# Patient Record
Sex: Female | Born: 1970
Health system: Southern US, Community
[De-identification: ages and names within clinical notes are randomized; demographics above are authoritative.]

## PROBLEM LIST (undated history)

## (undated) DIAGNOSIS — R42 Dizziness and giddiness: Secondary | ICD-10-CM

## (undated) DIAGNOSIS — J45909 Unspecified asthma, uncomplicated: Secondary | ICD-10-CM

---

## 1999-03-19 ENCOUNTER — Other Ambulatory Visit: Admission: RE | Admit: 1999-03-19 | Discharge: 1999-03-19 | Payer: Self-pay | Admitting: Emergency Medicine

## 2000-07-03 ENCOUNTER — Other Ambulatory Visit: Admission: RE | Admit: 2000-07-03 | Discharge: 2000-07-03 | Payer: Self-pay | Admitting: Emergency Medicine

## 2001-04-07 ENCOUNTER — Encounter (INDEPENDENT_AMBULATORY_CARE_PROVIDER_SITE_OTHER): Payer: Self-pay

## 2001-04-07 ENCOUNTER — Inpatient Hospital Stay (HOSPITAL_COMMUNITY): Admission: AD | Admit: 2001-04-07 | Discharge: 2001-04-11 | Payer: Self-pay | Admitting: *Deleted

## 2001-06-09 ENCOUNTER — Other Ambulatory Visit: Admission: RE | Admit: 2001-06-09 | Discharge: 2001-06-09 | Payer: Self-pay | Admitting: Gynecology

## 2002-07-12 ENCOUNTER — Other Ambulatory Visit: Admission: RE | Admit: 2002-07-12 | Discharge: 2002-07-12 | Payer: Self-pay | Admitting: *Deleted

## 2002-10-13 ENCOUNTER — Other Ambulatory Visit: Admission: RE | Admit: 2002-10-13 | Discharge: 2002-10-13 | Payer: Self-pay | Admitting: Gynecology

## 2002-10-22 ENCOUNTER — Ambulatory Visit (HOSPITAL_COMMUNITY): Admission: RE | Admit: 2002-10-22 | Discharge: 2002-10-22 | Payer: Self-pay | Admitting: Internal Medicine

## 2002-10-22 ENCOUNTER — Encounter: Payer: Self-pay | Admitting: Internal Medicine

## 2004-08-09 ENCOUNTER — Other Ambulatory Visit: Admission: RE | Admit: 2004-08-09 | Discharge: 2004-08-09 | Payer: Self-pay | Admitting: Gynecology

## 2006-07-02 ENCOUNTER — Other Ambulatory Visit: Admission: RE | Admit: 2006-07-02 | Discharge: 2006-07-02 | Payer: Self-pay | Admitting: Gynecology

## 2007-08-31 ENCOUNTER — Other Ambulatory Visit: Admission: RE | Admit: 2007-08-31 | Discharge: 2007-08-31 | Payer: Self-pay | Admitting: Gynecology

## 2008-03-18 ENCOUNTER — Ambulatory Visit: Payer: Self-pay | Admitting: Women's Health

## 2008-08-16 ENCOUNTER — Emergency Department (HOSPITAL_COMMUNITY): Admission: EM | Admit: 2008-08-16 | Discharge: 2008-08-16 | Payer: Self-pay | Admitting: *Deleted

## 2008-09-15 ENCOUNTER — Other Ambulatory Visit: Admission: RE | Admit: 2008-09-15 | Discharge: 2008-09-15 | Payer: Self-pay | Admitting: Gynecology

## 2008-09-15 ENCOUNTER — Encounter: Payer: Self-pay | Admitting: Women's Health

## 2008-09-15 ENCOUNTER — Ambulatory Visit: Payer: Self-pay | Admitting: Women's Health

## 2009-09-18 ENCOUNTER — Other Ambulatory Visit: Admission: RE | Admit: 2009-09-18 | Discharge: 2009-09-18 | Payer: Self-pay | Admitting: Gynecology

## 2009-09-18 ENCOUNTER — Ambulatory Visit: Payer: Self-pay | Admitting: Women's Health

## 2009-12-18 ENCOUNTER — Ambulatory Visit: Payer: Self-pay | Admitting: Women's Health

## 2010-01-22 ENCOUNTER — Ambulatory Visit: Payer: Self-pay | Admitting: Gynecology

## 2010-02-06 ENCOUNTER — Ambulatory Visit: Payer: Self-pay | Admitting: Gynecology

## 2010-09-28 NOTE — Discharge Summary (Signed)
[facility]  PatientJEYDI, [name] Visit Number: [id number] MRN: [id number]          Service Type: Attending:  Gaetano Hawthorne. Lily Peer, M.D. Dictated by:   Antony Contras, [facility] Adm. Date:  04/07/01 Disc. Date: 04/11/01                             Discharge Summary  DISCHARGE DIAGNOSES:          1. Nonreassuring fetal heart rate tracing,                                  deep variables with late component                               2. Nuchal cord.                               3. Body cord.  PROCEDURES:                   Emergent primary low uterine transverse cesarean section with delivery of viable infant.  HISTORY OF PRESENT ILLNESS:   The patient is a 40 year old primigravida with an LMP of June 27, 2000, [facility] April 01, 2001.  Prenatal risk factors include a history of asthma.  PRENATAL LABORATORY DATA:     Blood type O positive, antibody screen negative. RPR, HBsAg, HIV nonreactive.  HOSPITAL COURSE:              The patient was admitted with onset of labor April 07, 2001.  On admission cervix was 2 cm, 50%, -1 station.  She had some mildly elevated blood pressure on admission with normal [facility] laboratories. During the course of her labor she developed a nonreassuring fetal heart rate tracing consistent with repetitive deep variable decelerations.  At the time her contractions was slower than baseline.  She did reach complete dilatation and attempted knee-chest position, ______ lateral position, and continued to push, without any further descent from 1+ station.  Since she was having repetitive deep variables and late components, it was decided to proceed with emergent primary low uterine segment transverse cesarean section.  This was performed by Dr. Lily Peer under epidural and general endotracheal anesthesia. Findings include delivery of a viable female infant in vertex presentation, nuchal cord x1.  The cord was also wrapped  around the left shoulder.  Clear amniotic fluid with vernix.  Weight 7 pounds 10 ounces, Apgars of 3 and 8, arterial cord pH 7.20.  POSTPARTUM COURSE:            The patient remained afebrile.  Had no difficulty voiding.  Was able to be discharged on her third postoperative day in satisfactory condition.  CBC:  Hematocrit 29, hemoglobin 10.5, WBC 223.  FOLLOW-UP:                    In six weeks.  MEDICATIONS:                  Continue with prenatal vitamins and iron. Motrin or Tylox for pain. Dictated by:   Antony Contras, [facility] Attending:  Gaetano Hawthorne. Lily Peer, M.D. DD:  05/15/01 TD:  05/16/01 Job: [id number]

## 2010-09-28 NOTE — Op Note (Signed)
[facility]  Patient:    [name], [name] Visit Number: [id number] MRN: [id number]          Service Type: OBS Location: 910A [id number] Attending Physician:  Merrily Pew Dictated by:   Gaetano Hawthorne. Lily Peer, M.D. Proc. Date: 04/08/01 Admit Date:  04/07/2001                             Operative Report  PREOPERATIVE DIAGNOSES:       1. Nonreassuring fetal heart rate tracing (deep variables with late component).                               2. Second stage of labor.                               3. Term intrauterine pregnancy.  POSTOPERATIVE DIAGNOSES:      1. Nonreassuring fetal heart rate tracing (deep variables with late component).                               2. Nuchal cord.                               3. Body cord.  OPERATION:                    Emergency primary lower uterine segment transverse cesarean section.  SURGEON:                      Juan H. Lily Peer, M.D.  ANESTHESIA:                   Epidural and general endotracheal anesthesia.  INDICATIONS:                  A 40 year old, gravida 1, para 0, term with nonreassuring fetal rate tracing consisting of repetitive deep variable decelerations at time of contractions with slow rise to baseline.  The patient reached complete dilatation, at attempted knee-chest position, oxygen, lateral positioning, and continued to push without any further descent from the +1 station.  Enough time was not given due to the fact that she was having repetitive deep variables with late components down to the 50-60 beats per minute range.  FINDINGS:                     Viable female infant in vertex presentation, nuchal cord x 1.  Cord was also wrapped around the left shoulder.  Clear amniotic with vernix present.  Weight 7 pounds 10 ounces.  Apgars 3 and 8. Arterial cord pH 7.20, delivery time 1617 hours.  COMPLICATIONS:                None.  DESCRIPTION OF PROCEDURE:     After the patient was  adequately counseled, she was taken to the operating room in the knee-chest position due to the fact that persistent deep variable decelerations down to the 50-60 minute range was evident with late component despite knee-chest position and oxygen administration.  She was rushed immediately to the operating room and both her and her husband were counseled verbally en route to the operating room.  Once in  the operating room, her fetal heart reached to 134.  She was placed in the supine position.  She was rechecked and the cervix was found to be essentially unchanged.  Complete dilatation, +1 station, and immediately on the supine position began having deep variable deceleration down to 50-60 beats per minute range with slow rise to baseline.  The abdomen was prepped and draped in the usual sterile fashion.  The epidural was not effective and the patient had to undergo general endotracheal anesthesia.  Once the drapes had been placed and the patient was intubated,  a Pfannenstiel skin incision was made 2 cm above the symphysis pubis.  The incision was carried down through the skin and subcutaneous tissue and down to the rectus fascia where a midline nick was made.  The fascia was incised in a transverse fashion.  The peritoneum cavity was entered and the bladder flap was established.  The lower uterine segment was incised in the transverse fashion.  Clear amniotic fluid with extensive amount of vernix was present.  The newborns head was delivered.  Manual reduction of the cord was accomplished and then the cord was wrapped around the left shoulder, reduced and the rest of the newborn was delivered.    delivered.  The nasopharyngeal area was bulb suctioned.  The cord was doubly clamped and excised and the newborn was passed off to the pediatrician in attendance.  After cord blood was obtained, the placenta was delivered from the anterior cavity and submitted for histologic evaluation. The  anterior uterine cavity was swept clear of remaining products of conception.  The uterus was closed in a two-layer fashion with 0 Vicryl suture; first layer an interlocking stitch, second layer in an imbricating manner.  Both tubes and ovaries were normal in appearance.  The uterus was placed back in the abdominal cavity.  The pelvic cavity was copiously irrigated with normal saline solution.  After ascertaining adequate hemostasis, fascia was then closed.  The visceral peritoneum was now reapproximated.  The fascia was closed with running stitches of 0 Vicryl suture.  The subcutaneous bleeders were Bovie cauterized and skin was reapproximated with skin clips followed by placement of Xeroform gauze and 4 x 4 dressing.  The patient was extubated, transferred to recovery room with stable vital signs.  Blood loss was 500 cc.  Urine output was 150 cc.  Fluid resuscitation consisted of 2500 lactated Ringers.  She received 1 g of Cefotan IV. Dictated by:   Gaetano Hawthorne Lily Peer, M.D. Attending Physician:  Merrily Pew DD:  04/08/01 TD:  04/09/01 Job: [id number]

## 2010-11-08 ENCOUNTER — Other Ambulatory Visit (HOSPITAL_COMMUNITY)
Admission: RE | Admit: 2010-11-08 | Discharge: 2010-11-08 | Disposition: A | Discharge status: Home / Self Care | Payer: BC Managed Care – PPO | Source: Ambulatory Visit | Attending: Family Medicine | Admitting: Family Medicine

## 2010-11-08 ENCOUNTER — Other Ambulatory Visit: Payer: Self-pay | Admitting: Family Medicine

## 2010-11-08 DIAGNOSIS — Z124 Encounter for screening for malignant neoplasm of cervix: Secondary | ICD-10-CM | POA: Insufficient documentation

## 2010-11-08 DIAGNOSIS — Z1159 Encounter for screening for other viral diseases: Secondary | ICD-10-CM | POA: Insufficient documentation

## 2013-06-14 ENCOUNTER — Other Ambulatory Visit: Payer: Self-pay

## 2013-06-14 DIAGNOSIS — Z1231 Encounter for screening mammogram for malignant neoplasm of breast: Secondary | ICD-10-CM

## 2013-07-05 ENCOUNTER — Ambulatory Visit: Payer: BC Managed Care – PPO

## 2013-07-19 ENCOUNTER — Ambulatory Visit
Admission: RE | Admit: 2013-07-19 | Discharge: 2013-07-19 | Disposition: A | Discharge status: Home / Self Care | Payer: BC Managed Care – PPO | Source: Ambulatory Visit

## 2013-07-19 DIAGNOSIS — Z1231 Encounter for screening mammogram for malignant neoplasm of breast: Secondary | ICD-10-CM

## 2014-03-16 ENCOUNTER — Other Ambulatory Visit: Payer: Self-pay | Admitting: Family Medicine

## 2014-03-16 ENCOUNTER — Other Ambulatory Visit (HOSPITAL_COMMUNITY)
Admission: RE | Admit: 2014-03-16 | Discharge: 2014-03-16 | Disposition: A | Discharge status: Home / Self Care | Payer: BC Managed Care – PPO | Source: Ambulatory Visit | Attending: Family Medicine | Admitting: Family Medicine

## 2014-03-16 DIAGNOSIS — Z1151 Encounter for screening for human papillomavirus (HPV): Secondary | ICD-10-CM | POA: Diagnosis present

## 2014-03-16 DIAGNOSIS — Z01419 Encounter for gynecological examination (general) (routine) without abnormal findings: Secondary | ICD-10-CM | POA: Insufficient documentation

## 2014-03-18 LAB — CYTOLOGY - PAP

## 2014-07-27 ENCOUNTER — Other Ambulatory Visit: Payer: Self-pay

## 2014-07-27 DIAGNOSIS — Z1231 Encounter for screening mammogram for malignant neoplasm of breast: Secondary | ICD-10-CM

## 2014-08-10 ENCOUNTER — Ambulatory Visit
Admission: RE | Admit: 2014-08-10 | Discharge: 2014-08-10 | Disposition: A | Discharge status: Home / Self Care | Payer: BLUE CROSS/BLUE SHIELD | Source: Ambulatory Visit

## 2014-08-10 DIAGNOSIS — Z1231 Encounter for screening mammogram for malignant neoplasm of breast: Secondary | ICD-10-CM

## 2014-08-12 ENCOUNTER — Other Ambulatory Visit: Payer: Self-pay | Admitting: Family Medicine

## 2014-08-12 DIAGNOSIS — R928 Other abnormal and inconclusive findings on diagnostic imaging of breast: Secondary | ICD-10-CM

## 2014-08-16 ENCOUNTER — Ambulatory Visit
Admission: RE | Admit: 2014-08-16 | Discharge: 2014-08-16 | Disposition: A | Discharge status: Home / Self Care | Payer: BLUE CROSS/BLUE SHIELD | Source: Ambulatory Visit | Attending: Family Medicine | Admitting: Family Medicine

## 2014-08-16 DIAGNOSIS — R928 Other abnormal and inconclusive findings on diagnostic imaging of breast: Secondary | ICD-10-CM

## 2015-08-10 ENCOUNTER — Other Ambulatory Visit: Payer: Self-pay

## 2015-08-10 DIAGNOSIS — Z1231 Encounter for screening mammogram for malignant neoplasm of breast: Secondary | ICD-10-CM

## 2015-08-30 ENCOUNTER — Ambulatory Visit
Admission: RE | Admit: 2015-08-30 | Discharge: 2015-08-30 | Disposition: A | Discharge status: Home / Self Care | Payer: BLUE CROSS/BLUE SHIELD | Source: Ambulatory Visit

## 2015-08-30 DIAGNOSIS — Z1231 Encounter for screening mammogram for malignant neoplasm of breast: Secondary | ICD-10-CM

## 2016-09-09 ENCOUNTER — Other Ambulatory Visit: Payer: Self-pay | Admitting: Family Medicine

## 2016-09-09 DIAGNOSIS — Z1231 Encounter for screening mammogram for malignant neoplasm of breast: Secondary | ICD-10-CM

## 2016-09-25 ENCOUNTER — Ambulatory Visit
Admission: RE | Admit: 2016-09-25 | Discharge: 2016-09-25 | Disposition: A | Discharge status: Home / Self Care | Payer: BLUE CROSS/BLUE SHIELD | Source: Ambulatory Visit | Attending: Family Medicine | Admitting: Family Medicine

## 2016-09-25 DIAGNOSIS — Z1231 Encounter for screening mammogram for malignant neoplasm of breast: Secondary | ICD-10-CM

## 2017-04-14 ENCOUNTER — Other Ambulatory Visit (HOSPITAL_COMMUNITY)
Admission: RE | Admit: 2017-04-14 | Discharge: 2017-04-14 | Disposition: A | Discharge status: Home / Self Care | Payer: BLUE CROSS/BLUE SHIELD | Source: Ambulatory Visit | Attending: Family Medicine | Admitting: Family Medicine

## 2017-04-14 ENCOUNTER — Other Ambulatory Visit: Payer: Self-pay | Admitting: Family Medicine

## 2017-04-14 DIAGNOSIS — Z124 Encounter for screening for malignant neoplasm of cervix: Secondary | ICD-10-CM | POA: Insufficient documentation

## 2017-04-15 LAB — CYTOLOGY - PAP
Diagnosis: NEGATIVE
HPV (WINDOPATH): NOT DETECTED

## 2017-06-10 ENCOUNTER — Emergency Department (HOSPITAL_COMMUNITY)
Admission: EM | Admit: 2017-06-10 | Discharge: 2017-06-11 | Disposition: A | Discharge status: Home / Self Care | Payer: BLUE CROSS/BLUE SHIELD | Attending: Emergency Medicine | Admitting: Emergency Medicine

## 2017-06-10 ENCOUNTER — Encounter (HOSPITAL_COMMUNITY): Payer: Self-pay | Admitting: Emergency Medicine

## 2017-06-10 DIAGNOSIS — R55 Syncope and collapse: Secondary | ICD-10-CM

## 2017-06-10 DIAGNOSIS — J45909 Unspecified asthma, uncomplicated: Secondary | ICD-10-CM | POA: Insufficient documentation

## 2017-06-10 DIAGNOSIS — R42 Dizziness and giddiness: Secondary | ICD-10-CM

## 2017-06-10 DIAGNOSIS — Z87891 Personal history of nicotine dependence: Secondary | ICD-10-CM | POA: Insufficient documentation

## 2017-06-10 DIAGNOSIS — E860 Dehydration: Secondary | ICD-10-CM | POA: Diagnosis not present

## 2017-06-10 HISTORY — DX: Dizziness and giddiness: R42

## 2017-06-10 HISTORY — DX: Unspecified asthma, uncomplicated: J45.909

## 2017-06-10 LAB — BASIC METABOLIC PANEL
Anion gap: 15 (ref 5–15)
BUN: 17 mg/dL (ref 6–20)
CALCIUM: 9.9 mg/dL (ref 8.9–10.3)
CHLORIDE: 101 mmol/L (ref 101–111)
CO2: 20 mmol/L — ABNORMAL LOW (ref 22–32)
Creatinine, Ser: 1.09 mg/dL — ABNORMAL HIGH (ref 0.44–1.00)
GFR calc non Af Amer: >60 mL/min (ref >60)
GLUCOSE: 193 mg/dL — AB (ref 65–99)
Potassium: 3.7 mmol/L (ref 3.5–5.1)
Sodium: 136 mmol/L (ref 135–145)

## 2017-06-10 LAB — I-STAT BETA HCG BLOOD, ED (MC, WL, AP ONLY): I-stat hCG, quantitative: 5.6 m[IU]/mL — ABNORMAL HIGH (ref <5)

## 2017-06-10 LAB — CBC
HCT: 38.1 % (ref 36.0–46.0)
Hemoglobin: 13 g/dL (ref 12.0–15.0)
MCH: 30 pg (ref 26.0–34.0)
MCHC: 34.1 g/dL (ref 30.0–36.0)
MCV: 88 fL (ref 78.0–100.0)
Platelets: 419 10*3/uL — ABNORMAL HIGH (ref 150–400)
RBC: 4.33 MIL/uL (ref 3.87–5.11)
RDW: 14.1 % (ref 11.5–15.5)
WBC: 16.7 10*3/uL — ABNORMAL HIGH (ref 4.0–10.5)

## 2017-06-10 LAB — URINALYSIS, ROUTINE W REFLEX MICROSCOPIC
Bacteria, UA: NONE SEEN
Bilirubin Urine: NEGATIVE
Glucose, UA: NEGATIVE mg/dL
Hgb urine dipstick: NEGATIVE
Ketones, ur: 80 mg/dL — AB
Leukocytes, UA: NEGATIVE
Nitrite: NEGATIVE
PH: 5 (ref 5.0–8.0)
Protein, ur: 30 mg/dL — AB
SPECIFIC GRAVITY, URINE: 1.029 (ref 1.005–1.030)

## 2017-06-10 NOTE — ED Triage Notes (Signed)
BIB EMS from home for onset of dizziness, N/V at 2030. Pt states hx of vertigo but states this episode was worse than usual. Pt also had panic attack en route. Given 4mg  Zofran IV en route

## 2017-06-11 LAB — PREGNANCY, URINE: Preg Test, Ur: NEGATIVE

## 2017-06-11 MED ORDER — SODIUM CHLORIDE 0.9 % IV BOLUS (SEPSIS)
1000.0000 mL | Freq: Once | INTRAVENOUS | Status: AC
  Administered 2017-06-11: 1000 mL via INTRAVENOUS

## 2017-06-11 MED ORDER — ONDANSETRON HCL 4 MG/2ML IJ SOLN
4.0000 mg | Freq: Once | INTRAMUSCULAR | Status: AC
  Administered 2017-06-11: 4 mg via INTRAVENOUS
  Filled 2017-06-11: qty 2

## 2017-06-11 NOTE — Discharge Instructions (Signed)

## 2017-06-11 NOTE — ED Notes (Signed)
PT states understanding of care given, follow up care. PT ambulated from ED to car with a steady gait.  

## 2017-06-11 NOTE — ED Provider Notes (Signed)
[facility] EMERGENCY DEPARTMENT Provider Note   CSN: [id number] Arrival date & time: 06/10/17  2308     History   Chief Complaint Chief Complaint  Patient presents with  . Dizziness  . Emesis    HPI [name] is a 47 y.o. female.  The history is provided by the patient.  Dizziness  Quality:  Lightheadedness and room spinning Severity:  Moderate Onset quality:  Sudden Duration:  4 hours Timing:  Constant Progression:  Improving Chronicity:  Recurrent Relieved by:  Being still Worsened by:  Movement Associated symptoms: headaches, nausea, shortness of breath and vomiting   Associated symptoms: no chest pain, no hearing loss, no syncope, no tinnitus and no vision changes   Risk factors: no heart disease and no new medications   Emesis   Associated symptoms include headaches. Pertinent negatives include no fever.  Patient with history of asthma, history of recurrent vertigo, presents with feeling lightheaded, near syncope, and vertigo. In terms of her vertigo, she reports she gets this frequently, and usually has to take a Dramamine during the day. Tonight soon after dinner she began having feeling of lightheadedness, nausea and she went to the restroom and vomited.  It was nonbloody.  No diarrhea reported after that she started to feel like her vertigo was returning.  She has mild headache.  She has no focal arm or leg weakness.  No hearing or visual changes.  No diplopia. No active chest pain.  Soon after the episode of vomiting she began to have shortness of breath and felt very anxious with numbness throughout her entire body, that is improving No head trauma reported  No history of CVA/CAD/PE  Past Medical History:  Diagnosis Date  . Asthma   . Vertigo     There are no active problems to display for this patient.   Past Surgical History:  Procedure Laterality Date  . CESAREAN SECTION      OB History    No data available        Home Medications    Prior to Admission medications   Not on File    Family History No family history on file.  Social History Social History   Tobacco Use  . Smoking status: Former Smoker    Types: Cigarettes    Last attempt to quit: 05/2007    Years since quitting: 10.0  . Smokeless tobacco: Never Used  Substance Use Topics  . Alcohol use: Yes    Comment: occasional, 2/wk  . Drug use: No     Allergies   Penicillins and Sulfa antibiotics   Review of Systems Review of Systems  Constitutional: Negative for fever.  HENT: Negative for hearing loss and tinnitus.   Respiratory: Positive for shortness of breath.   Cardiovascular: Negative for chest pain and syncope.  Gastrointestinal: Positive for nausea and vomiting.  Neurological: Positive for dizziness and headaches. Negative for seizures and syncope.  Psychiatric/Behavioral: The patient is nervous/anxious.   All other systems reviewed and are negative.    Physical Exam Updated Vital Signs BP 111/84 (BP Location: Right Arm)   Pulse 95   Temp 97.6 F (36.4 C) (Oral)   Resp 18   Ht 1.727 m (5\' 8" )   Wt 117.9 kg (260 lb)   LMP 05/13/2017 (Approximate)   SpO2 100%   BMI 39.53 kg/m   Physical Exam CONSTITUTIONAL: Well developed/well nourished HEAD: Normocephalic/atraumatic EYES: EOMI/PERRL, no nystagmus, no ptosis ENMT: Mucous membranes moist NECK: supple no  meningeal signs, no bruits CV: S1/S2 noted, no murmurs/rubs/gallops noted LUNGS: Lungs are clear to auscultation bilaterally, no apparent distress ABDOMEN: soft, nontender, no rebound or guarding GU:no cva tenderness NEURO:Awake/alert, face symmetric, no arm or leg drift is noted Equal 5/5 strength with shoulder abduction, elbow flex/extension, wrist flex/extension in upper extremities and equal hand grips bilaterally Equal 5/5 strength with hip flexion,knee flex/extension, foot dorsi/plantar flexion Cranial nerves 3/4/5/6/11/18/08/11/12 tested  and intact Gait normal without ataxia No past pointing Sensation to light touch intact in all extremities EXTREMITIES: pulses normal, full ROM SKIN: warm, color normal PSYCH: no abnormalities of mood noted   ED Treatments / Results  Labs (all labs ordered are listed, but only abnormal results are displayed) Labs Reviewed  BASIC METABOLIC PANEL - Abnormal; Notable for the following components:      Result Value   CO2 20 (*)    Glucose, Bld 193 (*)    Creatinine, Ser 1.09 (*)    All other components within normal limits  CBC - Abnormal; Notable for the following components:   WBC 16.7 (*)    Platelets 419 (*)    All other components within normal limits  URINALYSIS, ROUTINE W REFLEX MICROSCOPIC - Abnormal; Notable for the following components:   APPearance CLOUDY (*)    Ketones, ur 80 (*)    Protein, ur 30 (*)    Squamous Epithelial / LPF 6-30 (*)    All other components within normal limits  I-STAT BETA HCG BLOOD, ED ([facility], [facility], [facility] ONLY) - Abnormal; Notable for the following components:   I-stat hCG, quantitative 5.6 (*)    All other components within normal limits  PREGNANCY, URINE    EKG  EKG Interpretation  Date/Time:  Tuesday June 10 2017 23:13:50 EST Ventricular Rate:  96 PR Interval:  130 QRS Duration: 88 QT Interval:  384 QTC Calculation: 485 R Axis:   98 Text Interpretation:  Normal sinus rhythm Rightward axis Prolonged QT Abnormal ECG No previous ECGs available Confirmed by Zadie Rhine ([id number]) on 06/11/2017 1:06:33 AM       EKG Interpretation  Date/Time:  Wednesday June 11 2017 02:29:58 EST Ventricular Rate:  70 PR Interval:  130 QRS Duration: 102 QT Interval:  438 QTC Calculation: 473 R Axis:   88 Text Interpretation:  Sinus rhythm HR improved from prior Confirmed by Zadie Rhine ([id number]) on 06/11/2017 3:09:49 AM       Radiology No results found.  Procedures Procedures   Medications Ordered in ED Medications  sodium chloride 0.9  % bolus 1,000 mL (1,000 mLs Intravenous New Bag/Given 06/11/17 0211)  ondansetron (ZOFRAN) injection 4 mg (4 mg Intravenous Given 06/11/17 0211)     Initial Impression / Assessment and Plan / ED Course  I have reviewed the triage vital signs and the nursing notes.  Pertinent labs results that were available during my care of the patient were reviewed by me and considered in my medical decision making (see chart for details).     1:41 AM Patient with history of recurrent vertigo, presents with a near syncopal episode as well as vertigo.  She has no focal neuro deficits, no ataxia noted, she is well-appearing.  Will treat with IV fluids and Zofran and reassess.  Will also need repeat EKG. Of note, patient is noted to have dehydration.  3:42 AM Repeat EKG improved, no prolonged QT.  Patient feels improved, ambulatory without difficulty.  No focal neuro deficits. My suspicion for acute neurologic emergency is low She is  low risk for syncope I feel she is appropriate d/c home and outpatient followup  Final Clinical Impressions(s) / ED Diagnoses   Final diagnoses:  Near syncope  Dehydration  Vertigo    ED Discharge Orders    None       Zadie RhineWickline, Letita Prentiss, MD 06/11/17 [phone]

## 2017-07-10 ENCOUNTER — Other Ambulatory Visit: Payer: Self-pay | Admitting: Otolaryngology

## 2017-07-10 DIAGNOSIS — H9311 Tinnitus, right ear: Secondary | ICD-10-CM

## 2017-07-10 DIAGNOSIS — G4452 New daily persistent headache (NDPH): Secondary | ICD-10-CM

## 2017-07-10 DIAGNOSIS — H918X2 Other specified hearing loss, left ear: Secondary | ICD-10-CM

## 2017-07-10 DIAGNOSIS — IMO0001 Reserved for inherently not codable concepts without codable children: Secondary | ICD-10-CM

## 2017-10-31 ENCOUNTER — Other Ambulatory Visit: Payer: Self-pay | Admitting: Family Medicine

## 2017-10-31 DIAGNOSIS — Z1231 Encounter for screening mammogram for malignant neoplasm of breast: Secondary | ICD-10-CM

## 2017-11-28 ENCOUNTER — Ambulatory Visit: Payer: BLUE CROSS/BLUE SHIELD

## 2017-12-18 ENCOUNTER — Ambulatory Visit
Admission: RE | Admit: 2017-12-18 | Discharge: 2017-12-18 | Disposition: A | Discharge status: Home / Self Care | Payer: 59 | Source: Ambulatory Visit | Attending: Family Medicine | Admitting: Family Medicine

## 2017-12-18 DIAGNOSIS — Z1231 Encounter for screening mammogram for malignant neoplasm of breast: Secondary | ICD-10-CM | POA: Diagnosis not present

## 2018-04-21 DIAGNOSIS — Z131 Encounter for screening for diabetes mellitus: Secondary | ICD-10-CM | POA: Diagnosis not present

## 2018-04-21 DIAGNOSIS — Z13220 Encounter for screening for lipoid disorders: Secondary | ICD-10-CM | POA: Diagnosis not present

## 2018-04-21 DIAGNOSIS — Z6841 Body Mass Index (BMI) 40.0 and over, adult: Secondary | ICD-10-CM | POA: Diagnosis not present

## 2018-04-21 DIAGNOSIS — Z0000 Encounter for general adult medical examination without abnormal findings: Secondary | ICD-10-CM | POA: Diagnosis not present

## 2019-01-25 ENCOUNTER — Other Ambulatory Visit: Payer: Self-pay | Admitting: Family Medicine

## 2019-01-25 DIAGNOSIS — Z1231 Encounter for screening mammogram for malignant neoplasm of breast: Secondary | ICD-10-CM

## 2019-02-11 ENCOUNTER — Other Ambulatory Visit: Payer: Self-pay

## 2019-02-11 ENCOUNTER — Ambulatory Visit
Admission: RE | Admit: 2019-02-11 | Discharge: 2019-02-11 | Disposition: A | Discharge status: Home / Self Care | Payer: 59 | Source: Ambulatory Visit | Attending: Family Medicine | Admitting: Family Medicine

## 2019-02-11 DIAGNOSIS — Z1231 Encounter for screening mammogram for malignant neoplasm of breast: Secondary | ICD-10-CM

## 2020-02-21 ENCOUNTER — Other Ambulatory Visit: Payer: Self-pay | Admitting: Family Medicine

## 2020-02-21 DIAGNOSIS — Z1231 Encounter for screening mammogram for malignant neoplasm of breast: Secondary | ICD-10-CM

## 2020-03-16 ENCOUNTER — Other Ambulatory Visit: Payer: Self-pay

## 2020-03-16 ENCOUNTER — Ambulatory Visit
Admission: RE | Admit: 2020-03-16 | Discharge: 2020-03-16 | Disposition: A | Discharge status: Home / Self Care | Payer: 59 | Source: Ambulatory Visit

## 2020-03-16 DIAGNOSIS — Z1231 Encounter for screening mammogram for malignant neoplasm of breast: Secondary | ICD-10-CM

## 2021-02-01 ENCOUNTER — Other Ambulatory Visit: Payer: Self-pay | Admitting: Family Medicine

## 2021-02-01 DIAGNOSIS — Z1231 Encounter for screening mammogram for malignant neoplasm of breast: Secondary | ICD-10-CM

## 2021-03-23 DIAGNOSIS — Z1231 Encounter for screening mammogram for malignant neoplasm of breast: Secondary | ICD-10-CM

## 2021-03-24 ENCOUNTER — Ambulatory Visit
Admission: RE | Admit: 2021-03-24 | Discharge: 2021-03-24 | Disposition: A | Discharge status: Home / Self Care | Payer: 59 | Source: Ambulatory Visit | Attending: Family Medicine | Admitting: Family Medicine

## 2021-03-24 ENCOUNTER — Other Ambulatory Visit: Payer: Self-pay

## 2021-03-24 DIAGNOSIS — Z1231 Encounter for screening mammogram for malignant neoplasm of breast: Secondary | ICD-10-CM

## 2021-12-25 IMAGING — MG MM DIGITAL SCREENING BILAT W/ TOMO AND CAD
8 series · 8 of 24 positions shown · non-contrast
Comparison: Previous exam(s).

CLINICAL DATA: Screening.

EXAM:
DIGITAL SCREENING BILATERAL MAMMOGRAM WITH TOMOSYNTHESIS AND CAD
TECHNIQUE: Bilateral screening digital craniocaudal and mediolateral oblique
mammograms were obtained. Bilateral screening digital breast
tomosynthesis was performed. The images were evaluated with
computer-aided detection.

[L MLO synth-2D]
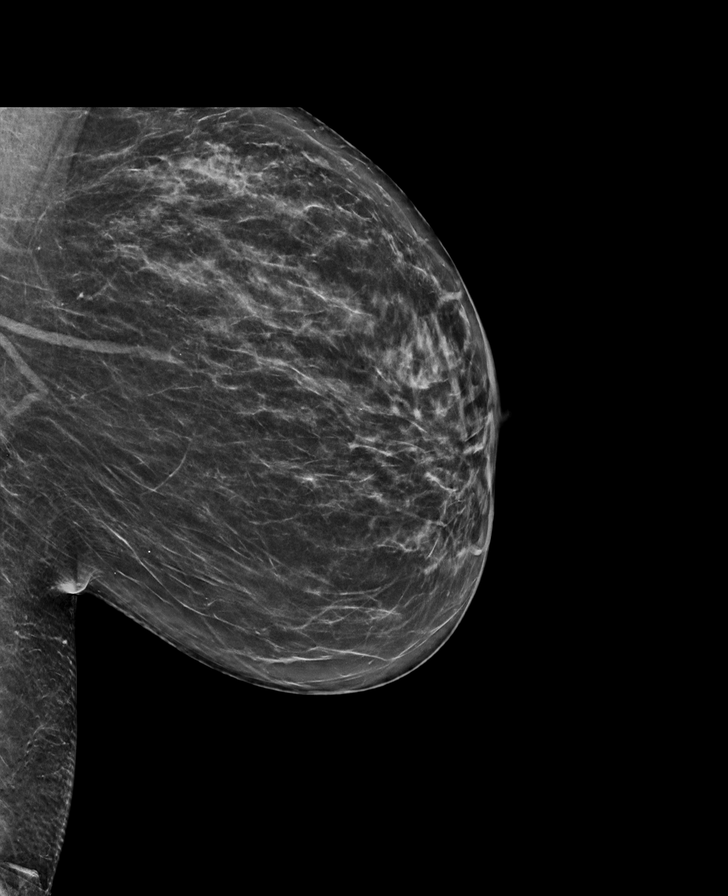

[L CC synth-2D]
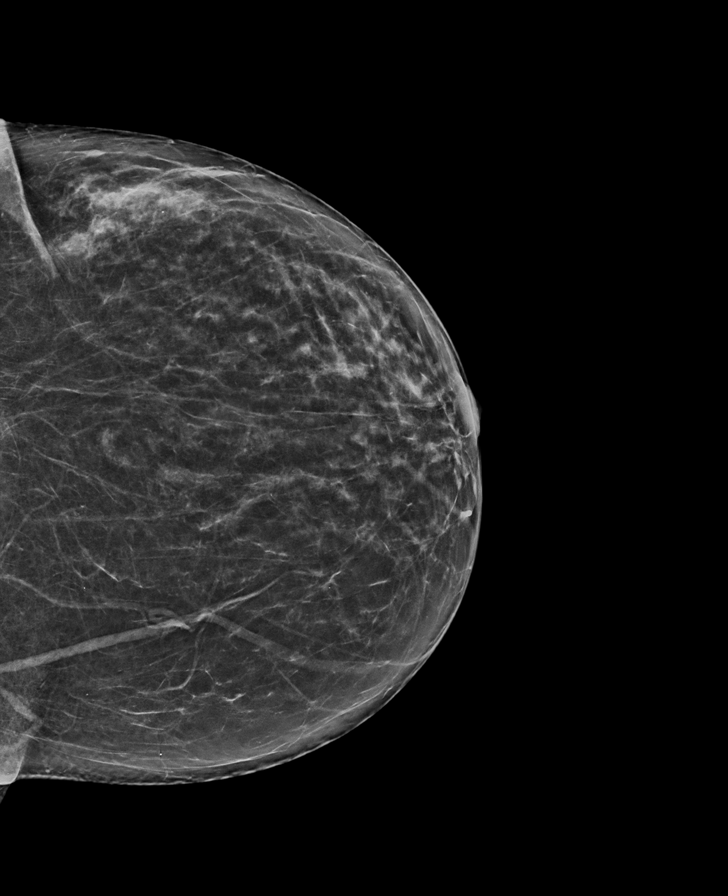

[R CC synth-2D]
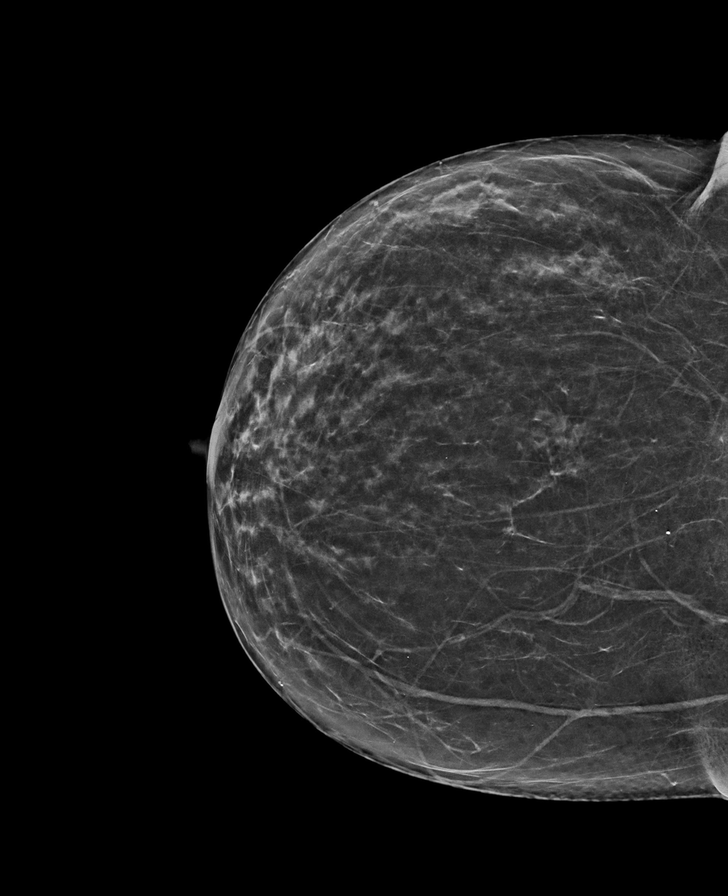

[R MLO synth-2D]
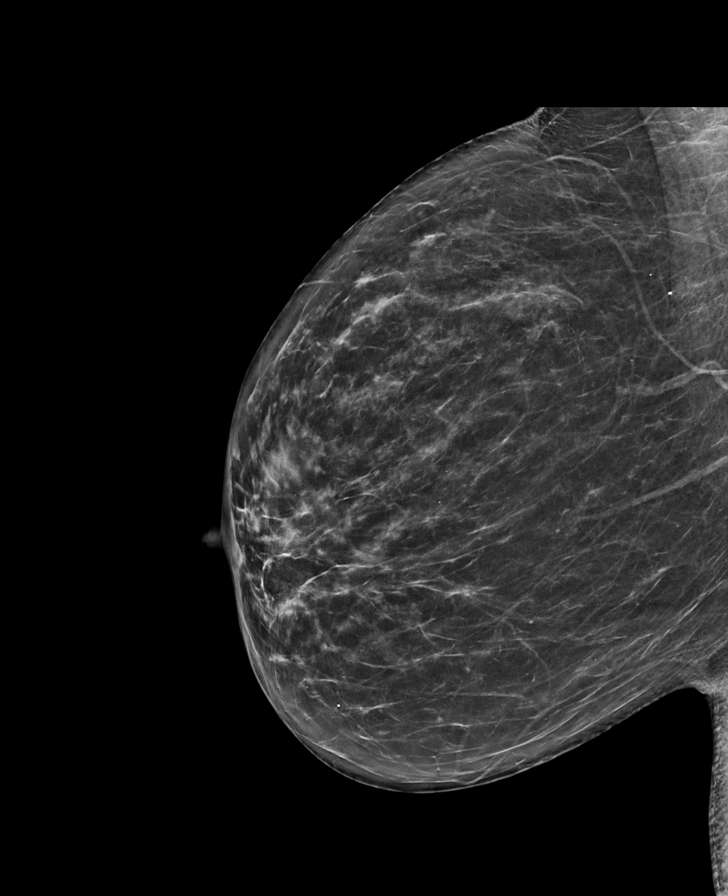

[R MLO tomo · tomo slice 35/70.0]
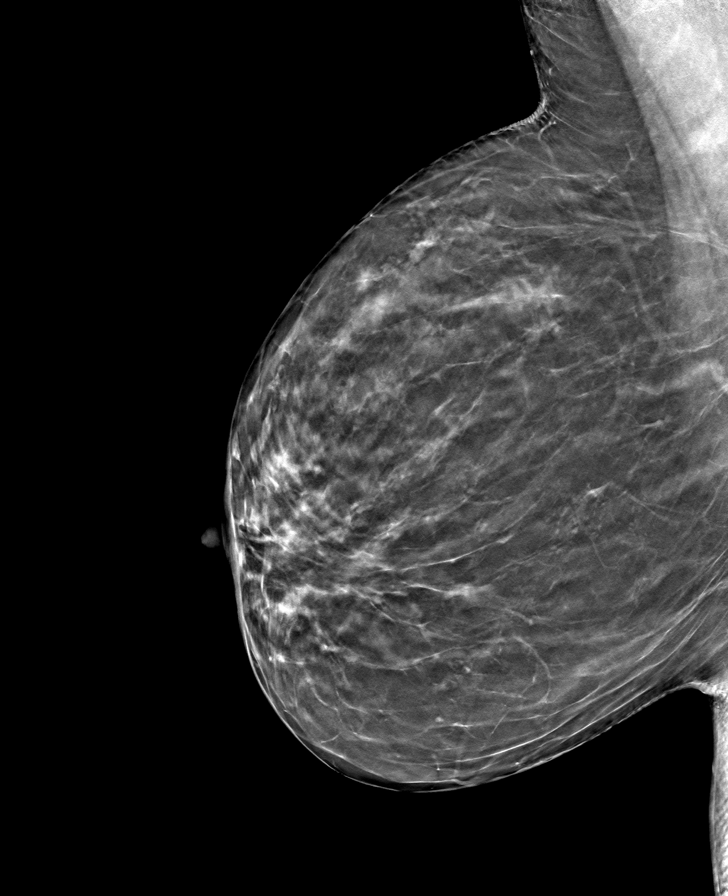

[L CC tomo · tomo slice 35/69.0]
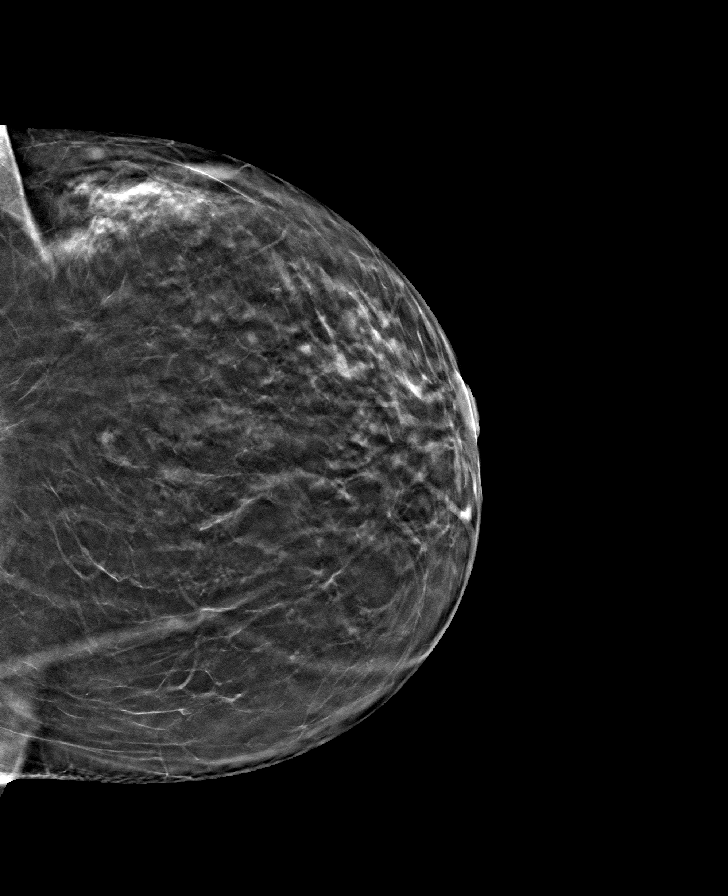

[L MLO tomo · tomo slice 37/74.0]
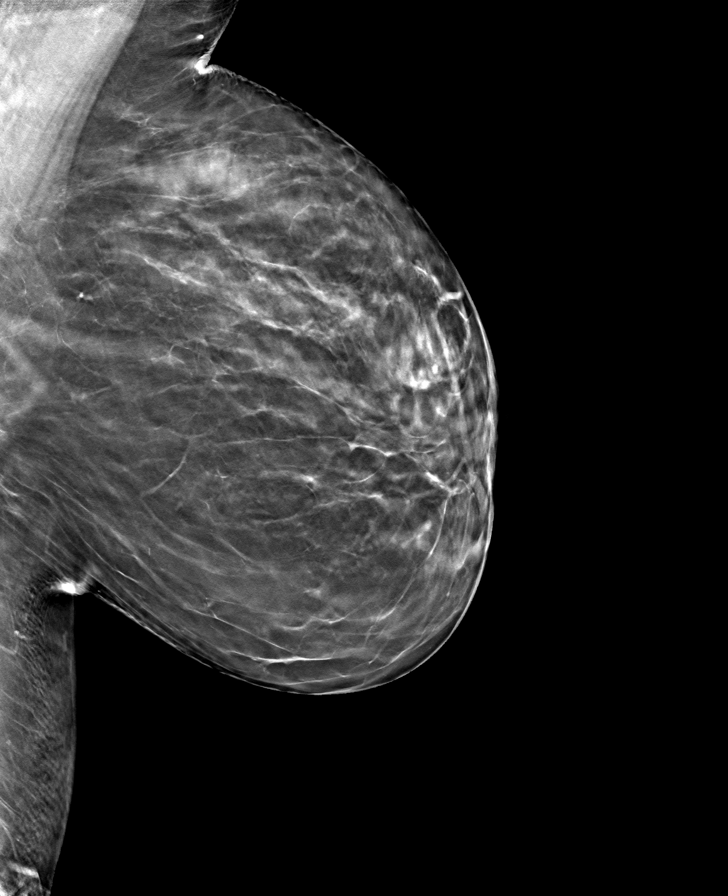

[R CC tomo · tomo slice 35/70.0]
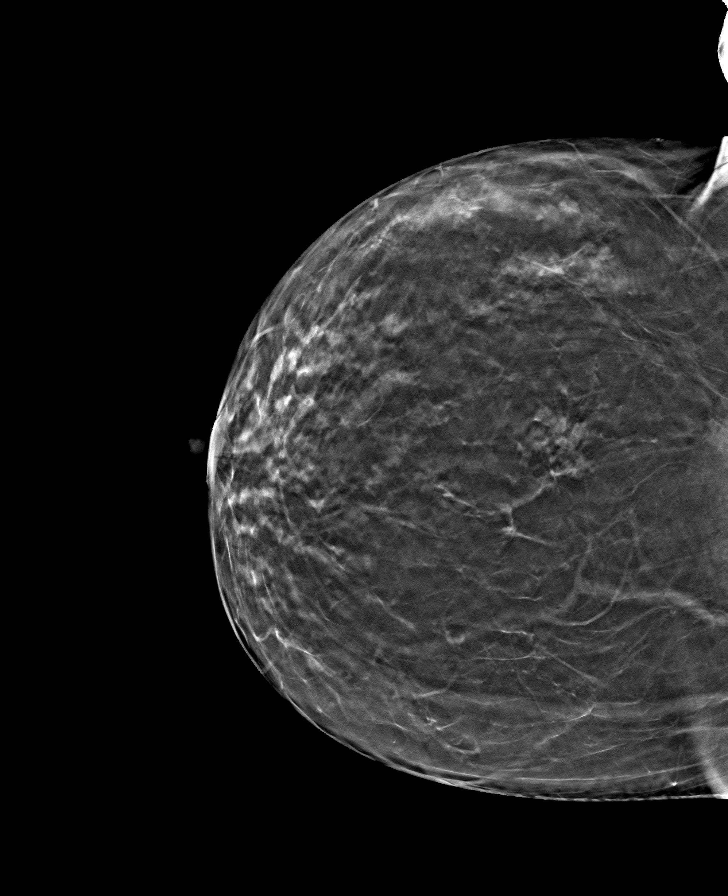

[8 of 24 positions shown; findings below may reference images not displayed]

ACR Breast Density Category b: There are scattered areas of
fibroglandular density.
FINDINGS: There are no findings suspicious for malignancy.
IMPRESSION: No mammographic evidence of malignancy. A result letter of this
screening mammogram will be mailed directly to the patient.

RECOMMENDATION:
Screening mammogram in one year. (Code:51-O-LD2)

BI-RADS CATEGORY  1: Negative.

## 2022-02-12 ENCOUNTER — Other Ambulatory Visit: Payer: Self-pay | Admitting: Family Medicine

## 2022-02-12 DIAGNOSIS — Z1231 Encounter for screening mammogram for malignant neoplasm of breast: Secondary | ICD-10-CM

## 2022-03-28 ENCOUNTER — Ambulatory Visit
Admission: RE | Admit: 2022-03-28 | Discharge: 2022-03-28 | Disposition: A | Discharge status: Home / Self Care | Payer: No Typology Code available for payment source | Source: Ambulatory Visit

## 2022-03-28 DIAGNOSIS — Z1231 Encounter for screening mammogram for malignant neoplasm of breast: Secondary | ICD-10-CM

## 2022-04-01 ENCOUNTER — Other Ambulatory Visit: Payer: Self-pay | Admitting: Family Medicine

## 2022-04-01 DIAGNOSIS — R928 Other abnormal and inconclusive findings on diagnostic imaging of breast: Secondary | ICD-10-CM

## 2022-04-17 ENCOUNTER — Other Ambulatory Visit: Payer: Self-pay | Admitting: Family Medicine

## 2022-04-17 ENCOUNTER — Ambulatory Visit
Admission: RE | Admit: 2022-04-17 | Discharge: 2022-04-17 | Disposition: A | Discharge status: Home / Self Care | Payer: No Typology Code available for payment source | Source: Ambulatory Visit | Attending: Family Medicine | Admitting: Family Medicine

## 2022-04-17 DIAGNOSIS — R928 Other abnormal and inconclusive findings on diagnostic imaging of breast: Secondary | ICD-10-CM

## 2022-04-17 DIAGNOSIS — N6310 Unspecified lump in the right breast, unspecified quadrant: Secondary | ICD-10-CM

## 2022-04-19 ENCOUNTER — Ambulatory Visit
Admission: RE | Admit: 2022-04-19 | Discharge: 2022-04-19 | Disposition: A | Discharge status: Home / Self Care | Payer: No Typology Code available for payment source | Source: Ambulatory Visit | Attending: Family Medicine | Admitting: Family Medicine

## 2022-04-19 ENCOUNTER — Encounter: Payer: Self-pay | Admitting: Family Medicine

## 2022-04-19 ENCOUNTER — Other Ambulatory Visit: Payer: Self-pay | Admitting: Family Medicine

## 2022-04-19 DIAGNOSIS — N6310 Unspecified lump in the right breast, unspecified quadrant: Secondary | ICD-10-CM

## 2022-04-19 HISTORY — PX: BREAST BIOPSY: SHX20

## 2023-03-15 ENCOUNTER — Other Ambulatory Visit: Payer: Self-pay | Admitting: Family Medicine

## 2023-03-15 DIAGNOSIS — Z1231 Encounter for screening mammogram for malignant neoplasm of breast: Secondary | ICD-10-CM

## 2023-04-07 ENCOUNTER — Ambulatory Visit
Admission: RE | Admit: 2023-04-07 | Discharge: 2023-04-07 | Disposition: A | Discharge status: Home / Self Care | Payer: No Typology Code available for payment source | Source: Ambulatory Visit

## 2023-04-07 DIAGNOSIS — Z1231 Encounter for screening mammogram for malignant neoplasm of breast: Secondary | ICD-10-CM

## 2024-03-15 ENCOUNTER — Other Ambulatory Visit: Payer: Self-pay | Admitting: Physician Assistant

## 2024-03-15 DIAGNOSIS — Z1231 Encounter for screening mammogram for malignant neoplasm of breast: Secondary | ICD-10-CM

## 2024-04-13 ENCOUNTER — Ambulatory Visit: Admission: RE | Admit: 2024-04-13 | Discharge: 2024-04-13 | Disposition: A | Source: Ambulatory Visit

## 2024-04-13 DIAGNOSIS — Z1231 Encounter for screening mammogram for malignant neoplasm of breast: Secondary | ICD-10-CM
